# Patient Record
Sex: Male | Born: 1985 | Race: Black or African American | Hispanic: No | Marital: Single | Smoking: Never smoker
Health system: Southern US, Community
[De-identification: ages and names within clinical notes are randomized; demographics above are authoritative.]

---

## 2019-08-04 ENCOUNTER — Other Ambulatory Visit: Payer: Self-pay

## 2019-08-04 ENCOUNTER — Emergency Department
Admission: EM | Admit: 2019-08-04 | Discharge: 2019-08-04 | Disposition: A | Discharge status: Home / Self Care | Payer: Self-pay | Source: Home / Self Care | Attending: Family Medicine | Admitting: Family Medicine

## 2019-08-04 DIAGNOSIS — M7021 Olecranon bursitis, right elbow: Secondary | ICD-10-CM

## 2019-08-04 LAB — POCT CBC W AUTO DIFF (K'VILLE URGENT CARE)

## 2019-08-04 LAB — URIC ACID: Uric Acid, Serum: 6.2 mg/dL (ref 4.0–8.0)

## 2019-08-04 LAB — EXTRA LAV TOP TUBE

## 2019-08-04 MED ORDER — DOXYCYCLINE HYCLATE 100 MG PO CAPS: 100.0000 mg | ORAL_CAPSULE | Freq: Two times a day (BID) | ORAL | 0 refills | Status: DC

## 2019-08-04 MED ORDER — HYDROCODONE-ACETAMINOPHEN 5-325 MG PO TABS: ORAL_TABLET | ORAL | 0 refills | Status: DC

## 2019-08-04 MED ORDER — PREDNISONE 20 MG PO TABS: ORAL_TABLET | ORAL | 0 refills | Status: DC

## 2019-08-04 NOTE — ED Triage Notes (Signed)
Pt states that he went to bed last night and woke up this morning with right elbow pain.  Has pain around the elbow and up and down the arm in a 2" diameter.

## 2019-08-04 NOTE — ED Provider Notes (Signed)
[facility]    CSN: [id number] Arrival date & time: 08/04/19  0901      History   Chief Complaint Chief Complaint  Patient presents with  . Elbow Pain    HPI Miguel Ochoa is a 34 y.o. male.   Patient awoke this morning with pain and mild swelling over the olecranon of his right elbow.  He has pain with flexion/extension.  He denies trauma to his elbow or repetitive movements of his right arm.  The history is provided by the patient.    History reviewed. No pertinent past medical history.  There are no problems to display for this patient.   History reviewed. No pertinent surgical history.     Home Medications    Prior to Admission medications   Medication Sig Start Date End Date Taking? Authorizing Provider  doxycycline (VIBRAMYCIN) 100 MG capsule Take 1 capsule (100 mg total) by mouth 2 (two) times daily. Take with food. 08/04/19   Lattie Haw, MD  HYDROcodone-acetaminophen (NORCO/VICODIN) 5-325 MG tablet Take one by mouth at bedtime as needed for pain 08/04/19   Lattie Haw, MD  predniSONE (DELTASONE) 20 MG tablet Take one tab by mouth twice daily for 4 days, then one daily for 3 days. Take with food. 08/04/19   Lattie Haw, MD    Family History History reviewed. No pertinent family history.  Social History Social History   Tobacco Use  . Smoking status: Never Smoker  . Smokeless tobacco: Never Used  Substance Use Topics  . Alcohol use: Yes    Comment: 1-2 on weekends  . Drug use: Not Currently     Allergies   Patient has no known allergies.   Review of Systems Review of Systems  Constitutional: Negative for appetite change, chills, diaphoresis, fatigue and fever.  Musculoskeletal: Positive for joint swelling.  Skin: Negative for color change, rash and wound.  All other systems reviewed and are negative.    Physical Exam Triage Vital Signs ED Triage Vitals  Enc Vitals Group     BP 08/04/19 0920 (!) 150/95     Pulse  Rate 08/04/19 0920 80     Resp 08/04/19 0920 20     Temp 08/04/19 0920 98.5 F (36.9 C)     Temp Source 08/04/19 0920 Oral     SpO2 08/04/19 0920 96 %     Weight 08/04/19 0921 198 lb (89.8 kg)     Height 08/04/19 0921 5\' 6"  (1.676 m)     Head Circumference --      Peak Flow --      Pain Score 08/04/19 0920 5     Pain Loc --      Pain Edu? --      Excl. in GC? --    No data found.  Updated Vital Signs BP (!) 150/95 (BP Location: Left Arm)   Pulse 80   Temp 98.5 F (36.9 C) (Oral)   Resp 20   Ht 5\' 6"  (1.676 m)   Wt 89.8 kg   SpO2 96%   BMI 31.96 kg/m   Visual Acuity Right Eye Distance:   Left Eye Distance:   Bilateral Distance:    Right Eye Near:   Left Eye Near:    Bilateral Near:     Physical Exam Vitals and nursing note reviewed.  Constitutional:      General: He is not in acute distress. HENT:     Head: Normocephalic.  Eyes:  Pupils: Pupils are equal, round, and reactive to light.  Cardiovascular:     Rate and Rhythm: Normal rate.  Pulmonary:     Effort: Pulmonary effort is normal.  Musculoskeletal:     Right elbow: Swelling present. No deformity, effusion or lacerations. Decreased range of motion. Tenderness present in olecranon process.       Arms:     Comments: Right elbow has decreased flexion/extension.  He has pain in olecranon with resisted extension of his right elbow.  There is warmth and distinct tenderness to palpation over olecreanon but minimal swelling.  No erythema present.  Distal neurovascular function is intact.   Skin:    General: Skin is warm and dry.     Findings: No rash.  Neurological:     Mental Status: He is alert.      UC Treatments / Results  Labs (all labs ordered are listed, but only abnormal results are displayed) Labs Reviewed  URIC ACID  POCT CBC W AUTO DIFF ([facility]):  WBC 11.8 LY 17.4; MO 5.6 ; GR 77.0; Hgb 15.0; Platelets 296     EKG   Radiology No results found.  Procedures Procedures  (including critical care time)  Medications Ordered in UC Medications - No data to display  Initial Impression / Assessment and Plan / UC Course  I have reviewed the triage vital signs and the nursing notes.  Pertinent labs & imaging results that were available during my care of the patient were reviewed by me and considered in my medical decision making (see chart for details).    Note leukocytosis (WBC 11.8). Suspect gout.  However, because of mild leukocytosis, will cover for cellulitis with doxycycline. Check Uric acid. Begin prednisone burst/taper. Rx for Lortab for pain at night (#5, no refill) Controlled Substance Prescriptions I have consulted the [place] Controlled Substances Registry for this patient, and feel the risk/benefit ratio today is favorable for proceeding with this prescription for a controlled substance.  Followup with Dr. Aundria Mems ([facility]) if not improving about two weeks.    Final Clinical Impressions(s) / UC Diagnoses   Final diagnoses:  Olecranon bursitis of right elbow     Discharge Instructions     Increase fluid intake. Apply ice pack for 20 to 30 minutes, 3 to 4 times daily  Continue until pain and swelling decrease.  May take Tylenol daytime if needed for pain.  If symptoms become significantly worse during the night or over the weekend, proceed to the local emergency room.     ED Prescriptions    Medication Sig Dispense Auth. Provider   predniSONE (DELTASONE) 20 MG tablet Take one tab by mouth twice daily for 4 days, then one daily for 3 days. Take with food. 11 tablet Kandra Nicolas, MD   doxycycline (VIBRAMYCIN) 100 MG capsule Take 1 capsule (100 mg total) by mouth 2 (two) times daily. Take with food. 14 capsule Kandra Nicolas, MD   HYDROcodone-acetaminophen (NORCO/VICODIN) 5-325 MG tablet Take one by mouth at bedtime as needed for pain 5 tablet Kandra Nicolas, MD        Kandra Nicolas, MD 08/04/19  1039

## 2019-08-04 NOTE — Discharge Instructions (Addendum)
Increase fluid intake. Apply ice pack for 20 to 30 minutes, 3 to 4 times daily  Continue until pain and swelling decrease.  May take Tylenol daytime if needed for pain.  If symptoms become significantly worse during the night or over the weekend, proceed to the local emergency room.

## 2020-05-01 ENCOUNTER — Emergency Department (HOSPITAL_COMMUNITY): Payer: No Typology Code available for payment source

## 2020-05-01 ENCOUNTER — Emergency Department (HOSPITAL_COMMUNITY)
Admission: EM | Admit: 2020-05-01 | Discharge: 2020-05-02 | Disposition: A | Discharge status: Home / Self Care | Payer: No Typology Code available for payment source | Attending: Emergency Medicine | Admitting: Emergency Medicine

## 2020-05-01 ENCOUNTER — Encounter (HOSPITAL_COMMUNITY): Payer: Self-pay

## 2020-05-01 DIAGNOSIS — U071 COVID-19: Secondary | ICD-10-CM | POA: Diagnosis not present

## 2020-05-01 DIAGNOSIS — R0602 Shortness of breath: Secondary | ICD-10-CM

## 2020-05-01 DIAGNOSIS — R079 Chest pain, unspecified: Secondary | ICD-10-CM

## 2020-05-01 LAB — URINALYSIS, ROUTINE W REFLEX MICROSCOPIC
Bacteria, UA: NONE SEEN
Bilirubin Urine: NEGATIVE
Glucose, UA: NEGATIVE mg/dL
Ketones, ur: NEGATIVE mg/dL
Leukocytes,Ua: NEGATIVE
Nitrite: NEGATIVE
Protein, ur: NEGATIVE mg/dL
Specific Gravity, Urine: 1.034 — ABNORMAL HIGH (ref 1.005–1.030)
pH: 6 (ref 5.0–8.0)

## 2020-05-01 LAB — CBC
HCT: 44.7 % (ref 39.0–52.0)
Hemoglobin: 14.5 g/dL (ref 13.0–17.0)
MCH: 28.7 pg (ref 26.0–34.0)
MCHC: 32.4 g/dL (ref 30.0–36.0)
MCV: 88.3 fL (ref 80.0–100.0)
Platelets: 311 10*3/uL (ref 150–400)
RBC: 5.06 MIL/uL (ref 4.22–5.81)
RDW: 11.3 % — ABNORMAL LOW (ref 11.5–15.5)
WBC: 6.3 10*3/uL (ref 4.0–10.5)
nRBC: 0 % (ref 0.0–0.2)

## 2020-05-01 LAB — BASIC METABOLIC PANEL
Anion gap: 12 (ref 5–15)
BUN: 9 mg/dL (ref 6–20)
CO2: 24 mmol/L (ref 22–32)
Calcium: 9.2 mg/dL (ref 8.9–10.3)
Chloride: 100 mmol/L (ref 98–111)
Creatinine, Ser: 0.94 mg/dL (ref 0.61–1.24)
GFR, Estimated: >60 mL/min (ref >60)
Glucose, Bld: 103 mg/dL — ABNORMAL HIGH (ref 70–99)
Potassium: 3.6 mmol/L (ref 3.5–5.1)
Sodium: 136 mmol/L (ref 135–145)

## 2020-05-01 LAB — LACTIC ACID, PLASMA
Lactic Acid, Venous: 1.4 mmol/L (ref 0.5–1.9)
Lactic Acid, Venous: 0.9 mmol/L (ref 0.5–1.9)

## 2020-05-01 LAB — D-DIMER, QUANTITATIVE: D-Dimer, Quant: 1.1 ug/mL-FEU — ABNORMAL HIGH (ref 0.00–0.50)

## 2020-05-01 LAB — TROPONIN I (HIGH SENSITIVITY)
Troponin I (High Sensitivity): 16 ng/L (ref <18)
Troponin I (High Sensitivity): 13 ng/L (ref <18)

## 2020-05-01 MED ORDER — IOHEXOL 350 MG/ML SOLN
75.0000 mL | Freq: Once | INTRAVENOUS | Status: AC | PRN
  Administered 2020-05-01: 75 mL via INTRAVENOUS

## 2020-05-01 MED ORDER — SODIUM CHLORIDE 0.9 % IV BOLUS
1000.0000 mL | Freq: Once | INTRAVENOUS | Status: AC
  Administered 2020-05-01: 1000 mL via INTRAVENOUS

## 2020-05-01 NOTE — ED Triage Notes (Signed)
Pt reports feeling sob over the past couple days. Pt reports testing positive for covid on 10/9. Pt also reports feeling dehydrated, and weak.

## 2020-05-01 NOTE — ED Provider Notes (Signed)
[facility] EMERGENCY DEPARTMENT Provider Note   CSN: [id number] Arrival date & time: 05/01/20  1750     History Chief Complaint  Patient presents with  . Shortness of Breath    Miguel Ochoa is a 34 y.o. male.  The history is provided by the patient and medical records. No language interpreter was used.  Shortness of Breath Severity:  Severe Onset quality:  Gradual Timing:  Constant Chronicity:  New Context: URI (covid)   Relieved by:  Nothing Worsened by:  Deep breathing and coughing Ineffective treatments:  None tried Associated symptoms: chest pain, cough, fever, hemoptysis and sputum production   Associated symptoms: no abdominal pain, no headaches, no neck pain, no rash, no vomiting and no wheezing        History reviewed. No pertinent past medical history.  There are no problems to display for this patient.   History reviewed. No pertinent surgical history.     No family history on file.  Social History   Tobacco Use  . Smoking status: Never Smoker  . Smokeless tobacco: Never Used  Substance Use Topics  . Alcohol use: Yes    Comment: 1-2 on weekends  . Drug use: Not Currently    Home Medications Prior to Admission medications   Medication Sig Start Date End Date Taking? Authorizing Provider  doxycycline (VIBRAMYCIN) 100 MG capsule Take 1 capsule (100 mg total) by mouth 2 (two) times daily. Take with food. 08/04/19   Lattie Haw, MD  HYDROcodone-acetaminophen (NORCO/VICODIN) 5-325 MG tablet Take one by mouth at bedtime as needed for pain 08/04/19   Lattie Haw, MD  predniSONE (DELTASONE) 20 MG tablet Take one tab by mouth twice daily for 4 days, then one daily for 3 days. Take with food. 08/04/19   Lattie Haw, MD    Allergies    Patient has no known allergies.  Review of Systems   Review of Systems  Constitutional: Positive for chills, fatigue and fever.  HENT: Positive for congestion.   Respiratory: Positive  for cough, hemoptysis, sputum production, chest tightness and shortness of breath. Negative for wheezing.   Cardiovascular: Positive for chest pain. Negative for palpitations.  Gastrointestinal: Negative for abdominal pain, constipation, diarrhea, nausea and vomiting.  Genitourinary: Negative for flank pain.  Musculoskeletal: Negative for back pain, neck pain and neck stiffness.  Skin: Negative for rash and wound.  Neurological: Negative for light-headedness and headaches.  Psychiatric/Behavioral: Negative for agitation and confusion.  All other systems reviewed and are negative.   Physical Exam Updated Vital Signs BP 131/88 (BP Location: Left Arm)   Pulse (!) 101   Temp 99.3 F (37.4 C) (Oral)   Resp 17   SpO2 95%   Physical Exam Vitals and nursing note reviewed.  Constitutional:      General: He is not in acute distress.    Appearance: He is well-developed. He is not ill-appearing, toxic-appearing or diaphoretic.  HENT:     Head: Normocephalic and atraumatic.  Eyes:     Conjunctiva/sclera: Conjunctivae normal.     Pupils: Pupils are equal, round, and reactive to light.  Cardiovascular:     Rate and Rhythm: Normal rate and regular rhythm.     Heart sounds: No murmur heard.   Pulmonary:     Effort: Pulmonary effort is normal. Tachypnea present. No respiratory distress.     Breath sounds: Rhonchi present. No decreased breath sounds, wheezing or rales.  Chest:     Chest wall: No  tenderness.  Abdominal:     Palpations: Abdomen is soft.     Tenderness: There is no abdominal tenderness.  Musculoskeletal:     Cervical back: Neck supple.     Right lower leg: No tenderness. No edema.     Left lower leg: No tenderness. No edema.  Skin:    General: Skin is warm and dry.     Capillary Refill: Capillary refill takes less than 2 seconds.     Findings: No erythema.  Neurological:     General: No focal deficit present.     Mental Status: He is alert.  Psychiatric:        Mood  and Affect: Mood normal.     ED Results / Procedures / Treatments   Labs (all labs ordered are listed, but only abnormal results are displayed) Labs Reviewed  BASIC METABOLIC PANEL - Abnormal; Notable for the following components:      Result Value   Glucose, Bld 103 (*)    All other components within normal limits  CBC - Abnormal; Notable for the following components:   RDW 11.3 (*)    All other components within normal limits  D-DIMER, QUANTITATIVE (NOT AT [facility]) - Abnormal; Notable for the following components:   D-Dimer, Quant 1.10 (*)    All other components within normal limits  URINALYSIS, ROUTINE W REFLEX MICROSCOPIC - Abnormal; Notable for the following components:   Specific Gravity, Urine 1.034 (*)    Hgb urine dipstick SMALL (*)    All other components within normal limits  URINE CULTURE  LACTIC ACID, PLASMA  LACTIC ACID, PLASMA  TROPONIN I (HIGH SENSITIVITY)  TROPONIN I (HIGH SENSITIVITY)    EKG EKG Interpretation  Date/Time:  Saturday May 01 2020 17:56:34 EDT Ventricular Rate:  102 PR Interval:  128 QRS Duration: 70 QT Interval:  316 QTC Calculation: 411 R Axis:   26 Text Interpretation: Sinus tachycardia Cannot rule out Anterior infarct , age undetermined Abnormal ECG No prior ECG for comparison. No STEMI Confirmed by Theda Belfast ([id number]) on 05/01/2020 6:27:24 PM   Radiology CT Angio Chest PE W and/or Wo Contrast  Result Date: 05/01/2020 CLINICAL DATA:  Positive COVID increased D-dimer EXAM: CT ANGIOGRAPHY CHEST WITH CONTRAST TECHNIQUE: Multidetector CT imaging of the chest was performed using the standard protocol during bolus administration of intravenous contrast. Multiplanar CT image reconstructions and MIPs were obtained to evaluate the vascular anatomy. CONTRAST:  60mL OMNIPAQUE IOHEXOL 350 MG/ML SOLN COMPARISON:  None. FINDINGS: Cardiovascular: There is a optimal opacification of the pulmonary arteries. There is no central,segmental, or  subsegmental filling defects within the pulmonary arteries. The heart is normal in size. No pericardial effusion or thickening. No evidence right heart strain. There is normal three-vessel brachiocephalic anatomy without proximal stenosis. The thoracic aorta is normal in appearance. Mediastinum/Nodes: No hilar, mediastinal, or axillary adenopathy. Thyroid gland, trachea, and esophagus demonstrate no significant findings. Lungs/Pleura: Multifocal ground-glass patchy airspace opacities are seen throughout both lungs, predominantly within the periphery. No pneumothorax or pleural effusion is seen. Upper Abdomen: No acute abnormalities present in the visualized portions of the upper abdomen. Musculoskeletal: No chest wall abnormality. No acute or significant osseous findings. Review of the MIP images confirms the above findings. IMPRESSION: No central, segmental, or subsegmental pulmonary embolism. Multifocal airspace opacities throughout both lungs, consistent with atypical viral pneumonia. Electronically Signed   By: Jonna Clark M.D.   On: 05/01/2020 21:54   DG Chest Portable 1 View  Result Date: 05/01/2020 CLINICAL DATA:  COVID positive last week. Worsening shortness of breath, fatigue, and chest tightness EXAM: PORTABLE CHEST 1 VIEW COMPARISON:  None. FINDINGS: Shallow inspiration. Patchy infiltrates in the lung bases, likely multifocal pneumonia. No pleural effusions. No pneumothorax. Mediastinal contours appear intact. IMPRESSION: Patchy infiltrates in the lung bases, likely multifocal pneumonia. Electronically Signed   By: Burman NievesWilliam  Stevens M.D.   On: 05/01/2020 19:32    Procedures Procedures (including critical care time)  Medications Ordered in ED Medications  sodium chloride 0.9 % bolus 1,000 mL (0 mLs Intravenous Stopped 05/01/20 2231)  iohexol (OMNIPAQUE) 350 MG/ML injection 75 mL (75 mLs Intravenous Contrast Given 05/01/20 2116)    ED Course  I have reviewed the triage vital signs and the  nursing notes.  Pertinent labs & imaging results that were available during my care of the patient were reviewed by me and considered in my medical decision making (see chart for details).    MDM Rules/Calculators/A&P                          Patrica DuelJeremy Simmer is a 34 y.o. male with no significant past medical history aside from recent diagnosis of COVID-19 2 weeks ago who presents with worsening chest tightness, shortness of breath, fatigue, urinary frequency, and malaise.  Patient reports that he was diagnosed with Covid 2 weeks ago and has had worsened short of breath and chest tightness over the last few days.  He reports the cough is somewhat improved but he still has some occasional hemoptysis and productive cough.  He reports no significant nausea, vomiting, constipation or diarrhea he does report urinary frequency.  No dysuria.  No recent leg pain or leg swelling.  He feels very fatigued and tired.  He feels he is very dehydrated despite trying to increase his fluids.  He is concerned about his breathing.  On exam, lungs have some rhonchi bilaterally with no significant wheezing.  No rales.  Chest is nontender, abdomen is nontender.  Good pulses in extremities.  No leg tenderness or leg swelling appreciated.  Patient was tachycardic on arrival and had temperature of 99.3 orally.  We will ambulate patient to make sure he is not hypoxic with ambulation.  He reports his symptoms were primarily pleuritic and exertional.  We will get a work-up to look for UTI given the increased urinary frequency.  We will get x-ray and labs including a D-dimer to rule out pulmonary embolism.  EKG did not show STEMI.  If patient is not hypoxic, do not find concerning abnormalities, anticipate he will be stable for discharge home with PCP follow-up.  Anticipate reassessment after work-up.   Final Clinical Impression(s) / ED Diagnoses Final diagnoses:  SOB (shortness of breath)  COVID-19  Chest pain, unspecified  type    Rx / DC Orders ED Discharge Orders    None      Clinical Impression: 1. SOB (shortness of breath)   2. COVID-19   3. Chest pain, unspecified type     Disposition: Discharge  Condition: Good  I have discussed the results, Dx and Tx plan with the pt(& family if present). He/she/they expressed understanding and agree(s) with the plan. Discharge instructions discussed at great length. Strict return precautions discussed and pt &/or family have verbalized understanding of the instructions. No further questions at time of discharge.    New Prescriptions   No medications on file    [facility] Up[facility] [address] [place] [address] [phone]    [facility] EMERGENCY DEPARTMENT [address] [id number] mc [place] [address] [phone]       Chandani Rogowski, Canary Brim, MD 05/01/20 [phone]

## 2020-05-01 NOTE — Discharge Instructions (Signed)
Your work-up today did not show evidence of collapsed lung, bacterial pneumonia, or blood clot.  Your heart enzymes were reassuring and your vital signs were also reassuring.  Please follow-up with the post Covid clinic and rest and stay hydrated.  If any symptoms change or worsen, please return to the nearest emergency department.

## 2021-11-13 ENCOUNTER — Other Ambulatory Visit: Payer: Self-pay

## 2021-11-13 ENCOUNTER — Encounter (HOSPITAL_COMMUNITY): Payer: Self-pay

## 2021-11-13 ENCOUNTER — Emergency Department (HOSPITAL_COMMUNITY): Payer: BC Managed Care – PPO

## 2021-11-13 ENCOUNTER — Emergency Department (HOSPITAL_COMMUNITY)
Admission: EM | Admit: 2021-11-13 | Discharge: 2021-11-14 | Disposition: A | Discharge status: Home / Self Care | Payer: BC Managed Care – PPO | Attending: Emergency Medicine | Admitting: Emergency Medicine

## 2021-11-13 DIAGNOSIS — S0231XA Fracture of orbital floor, right side, initial encounter for closed fracture: Secondary | ICD-10-CM | POA: Insufficient documentation

## 2021-11-13 DIAGNOSIS — S5011XA Contusion of right forearm, initial encounter: Secondary | ICD-10-CM | POA: Diagnosis not present

## 2021-11-13 DIAGNOSIS — S0285XA Fracture of orbit, unspecified, initial encounter for closed fracture: Secondary | ICD-10-CM

## 2021-11-13 DIAGNOSIS — S0993XA Unspecified injury of face, initial encounter: Secondary | ICD-10-CM | POA: Diagnosis present

## 2021-11-13 DIAGNOSIS — S60221A Contusion of right hand, initial encounter: Secondary | ICD-10-CM | POA: Diagnosis not present

## 2021-11-13 DIAGNOSIS — S01111A Laceration without foreign body of right eyelid and periocular area, initial encounter: Secondary | ICD-10-CM | POA: Diagnosis not present

## 2021-11-13 DIAGNOSIS — Z23 Encounter for immunization: Secondary | ICD-10-CM | POA: Insufficient documentation

## 2021-11-13 DIAGNOSIS — S0240EA Zygomatic fracture, right side, initial encounter for closed fracture: Secondary | ICD-10-CM | POA: Diagnosis not present

## 2021-11-13 DIAGNOSIS — H1131 Conjunctival hemorrhage, right eye: Secondary | ICD-10-CM

## 2021-11-13 DIAGNOSIS — S01119A Laceration without foreign body of unspecified eyelid and periocular area, initial encounter: Secondary | ICD-10-CM
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
## 2021-11-14 MED ORDER — TETANUS-DIPHTH-ACELL PERTUSSIS 5-2.5-18.5 LF-MCG/0.5 IM SUSY
0.5000 mL | PREFILLED_SYRINGE | Freq: Once | INTRAMUSCULAR | Status: AC
  Administered 2021-11-14: 0.5 mL via INTRAMUSCULAR
  Filled 2021-11-14: qty 0.5

## 2021-11-14 MED ORDER — OXYCODONE HCL 5 MG PO TABS: 5.0000 mg | ORAL_TABLET | ORAL | 0 refills | Status: AC | PRN

## 2021-11-14 MED ORDER — AMOXICILLIN-POT CLAVULANATE 875-125 MG PO TABS: 1.0000 | ORAL_TABLET | Freq: Two times a day (BID) | ORAL | 0 refills | Status: AC

## 2021-11-14 NOTE — Discharge Instructions (Addendum)
Call the eye doctor tomorrow morning for a follow up appointment. ?Follow up with orthopedics if your hand/arm pain persists. Apply ice for 20 minutes at a time and elevate. ?Antibiotics as prescribed for open wound over fracture, take as prescribed. ?

## 2023-10-09 IMAGING — CT CT MAXILLOFACIAL W/O CM
3 of 6 series · 15 of 47 positions shown, 18 images · non-contrast
Comparison: None Available.

CLINICAL DATA: Facial trauma.



[Series 3: maxilllofacial 2.0 hr40 3 · axial · 0.39mm/px · z∈[+1344,+1492]mm · 10 of 88 slices shown, 13 images]
[im 7/88  brain]
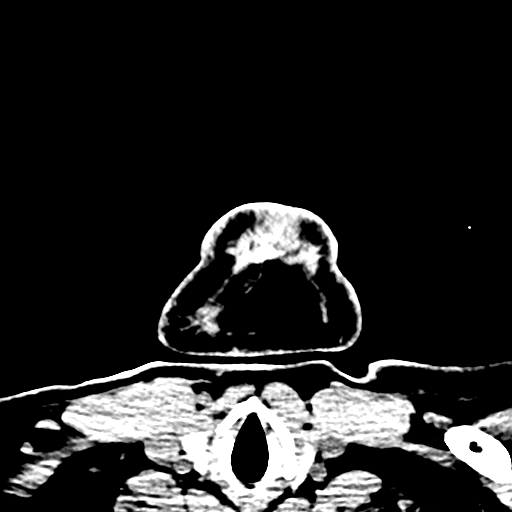
[im 7/88  bone]
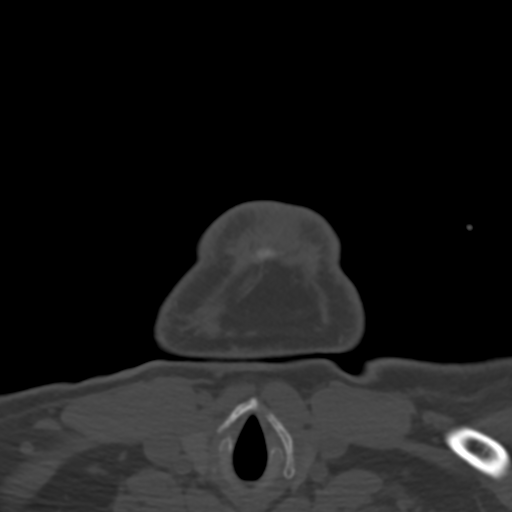
[im 13/88  bone]
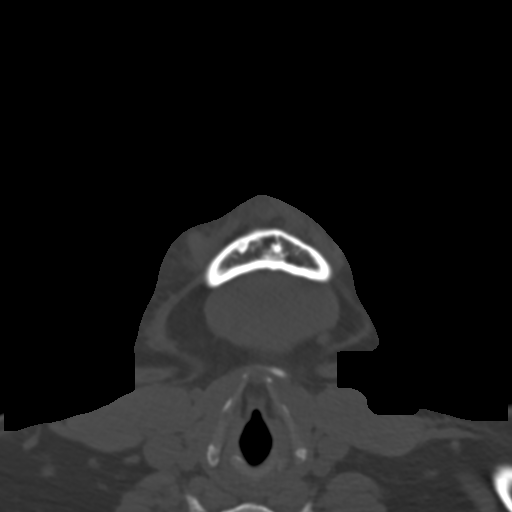
[im 25/88  bone]
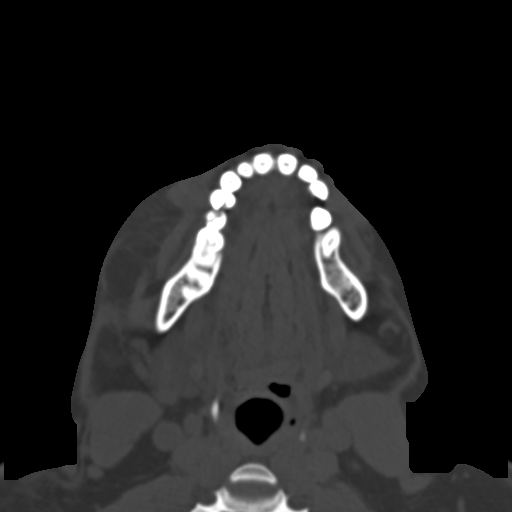
[im 32/88  bone]
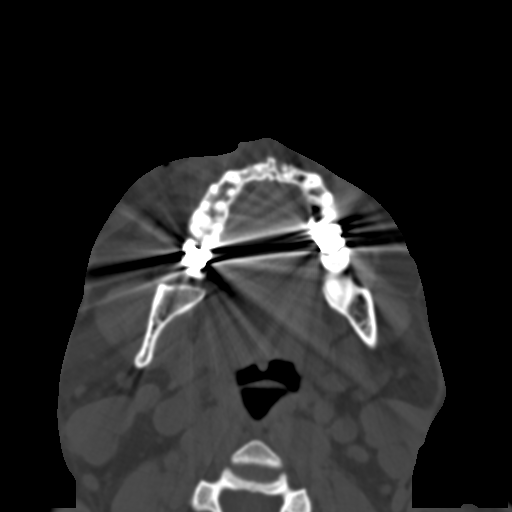
[im 38/88  brain]
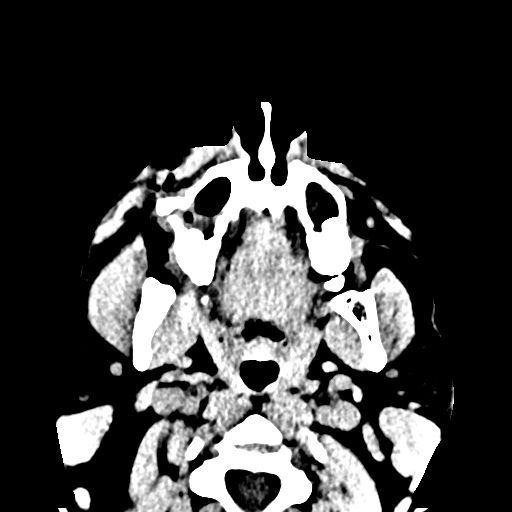
[im 38/88  bone]
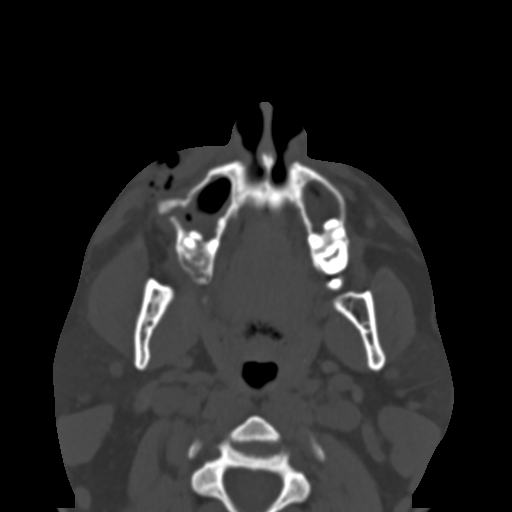
[im 50/88  bone]
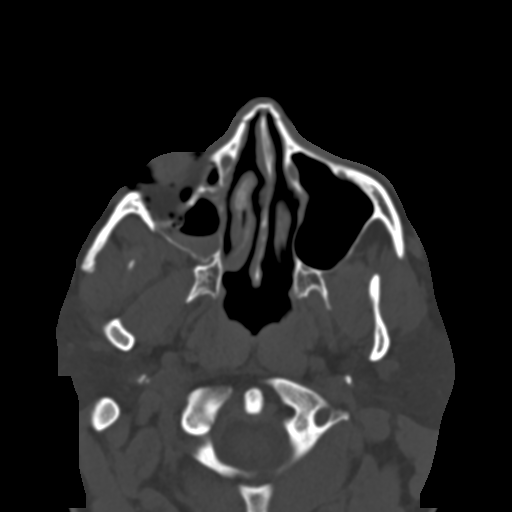
[im 56/88  bone]
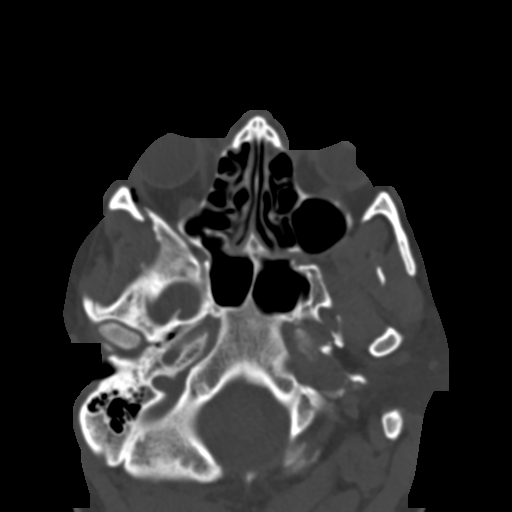
[im 63/88  bone]
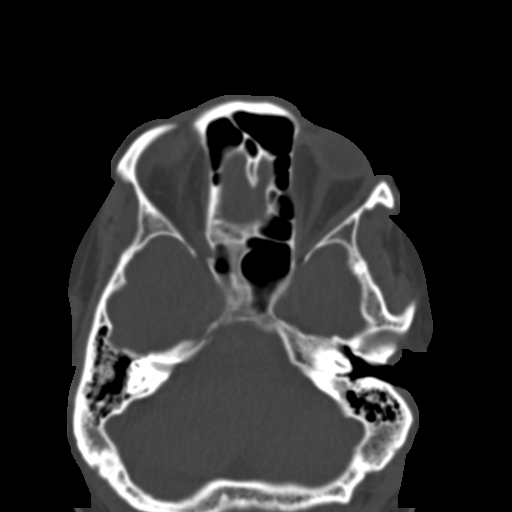
[im 75/88  brain]
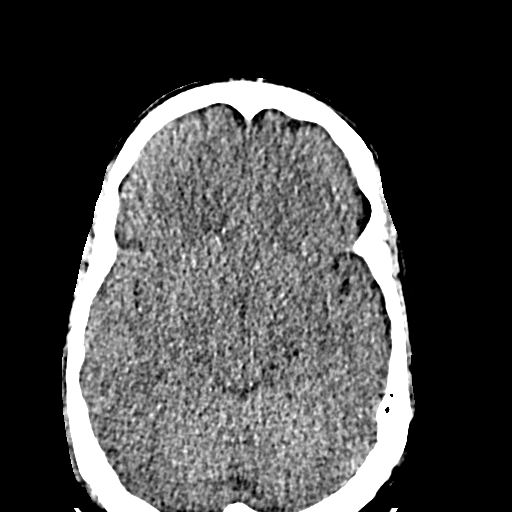
[im 75/88  bone]
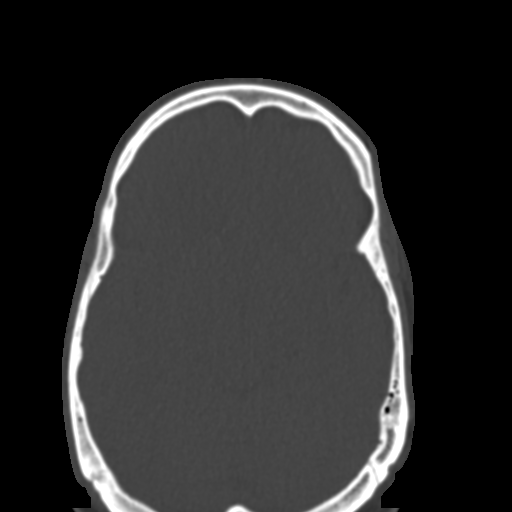
[im 81/88  bone]
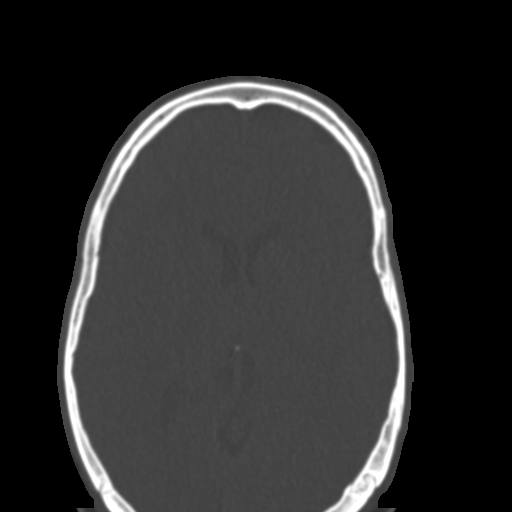

[Series 7: st cor · coronal · 0.37mm/px · 3 of 86 slices shown]
[im 22/86  bone]
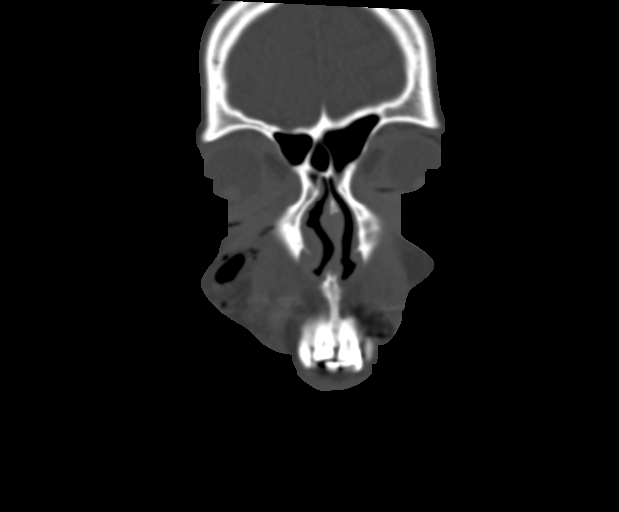
[im 43/86  bone]
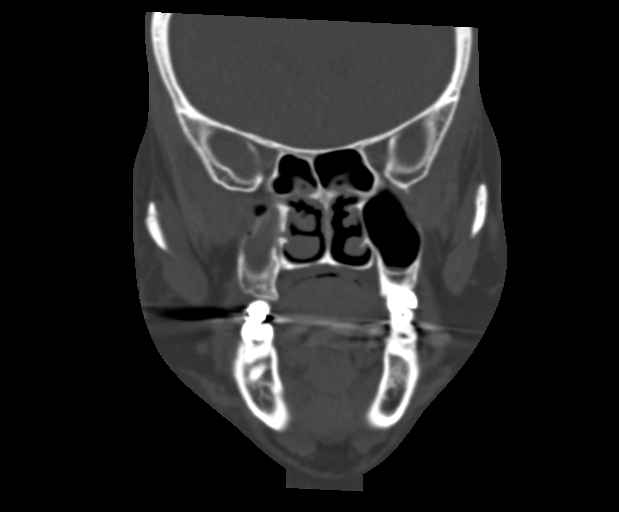
[im 64/86  bone]
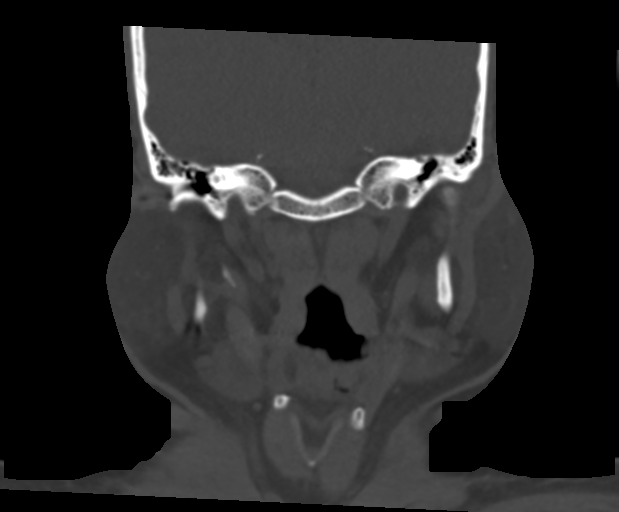

[Series 8: st sag · sagittal · 0.36mm/px · 2 of 96 slices shown]
[im 32/96  bone]
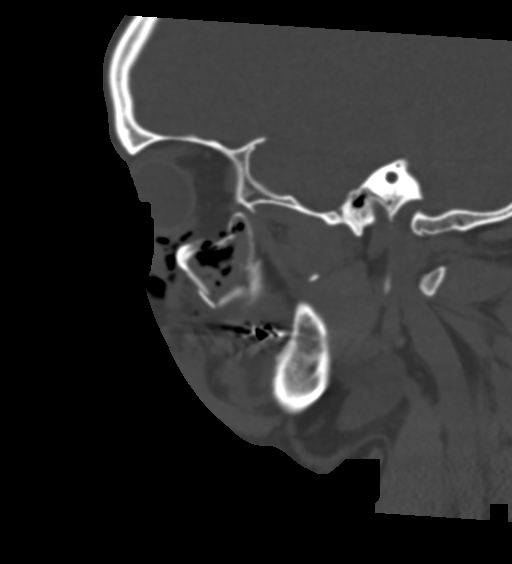
[im 64/96  bone]
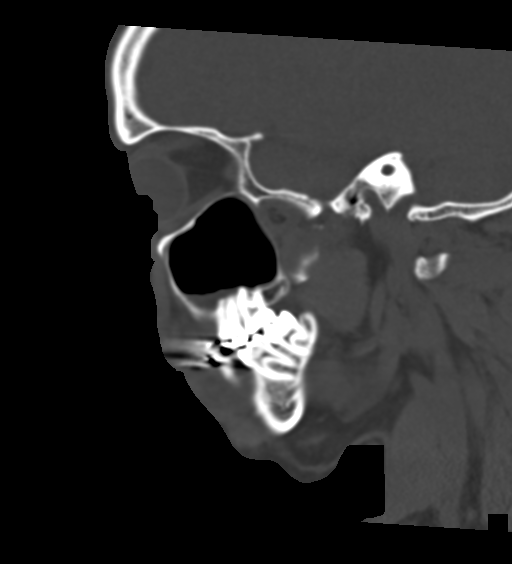

[15 of 47 positions shown; findings below may reference images not displayed]

FINDINGS: Osseous: There is a comminuted and depressed fracture of the right
orbital floor with slight herniation of the orbital fat. There are
displaced fractures of the anterior and posterolateral walls of the
right maxillary sinus. There is irregularity of the medial wall of
the right maxillary sinus suspicious for fracture. There is a
nondisplaced fracture of the posterior right zygomatic arch.
Probable slight sutural diastasis of the midportion of the right
zygomatic arch. Mildly displaced fracture lateral wall of the right
orbit. There is no mandibular dislocation.

Orbits: The globes and retrobulbar fat are preserved. There is mild
right orbital emphysema.

Sinuses: Partial opacification of the right maxillary sinus most
consistent with hemosinus. There is mild diffuse mucoperiosteal
thickening of the main der of the paranasal sinuses.

Soft tissues: Right periorbital soft tissue swelling and air.

Limited intracranial: No significant or unexpected finding.
IMPRESSION: Right facial bone fractures as above. Clinical correlation is
recommended to exclude ocular muscle entrapment.

## 2023-10-09 IMAGING — DX DG HAND COMPLETE 3+V*R*
3 series · 3 of 3 positions shown · non-contrast
Comparison: None Available.

CLINICAL DATA: Injury. Pain in the mid and distal ulna. Pain in the
fifth metacarpal

EXAM:
RIGHT HAND - COMPLETE 3+ VIEW

[hand ap]
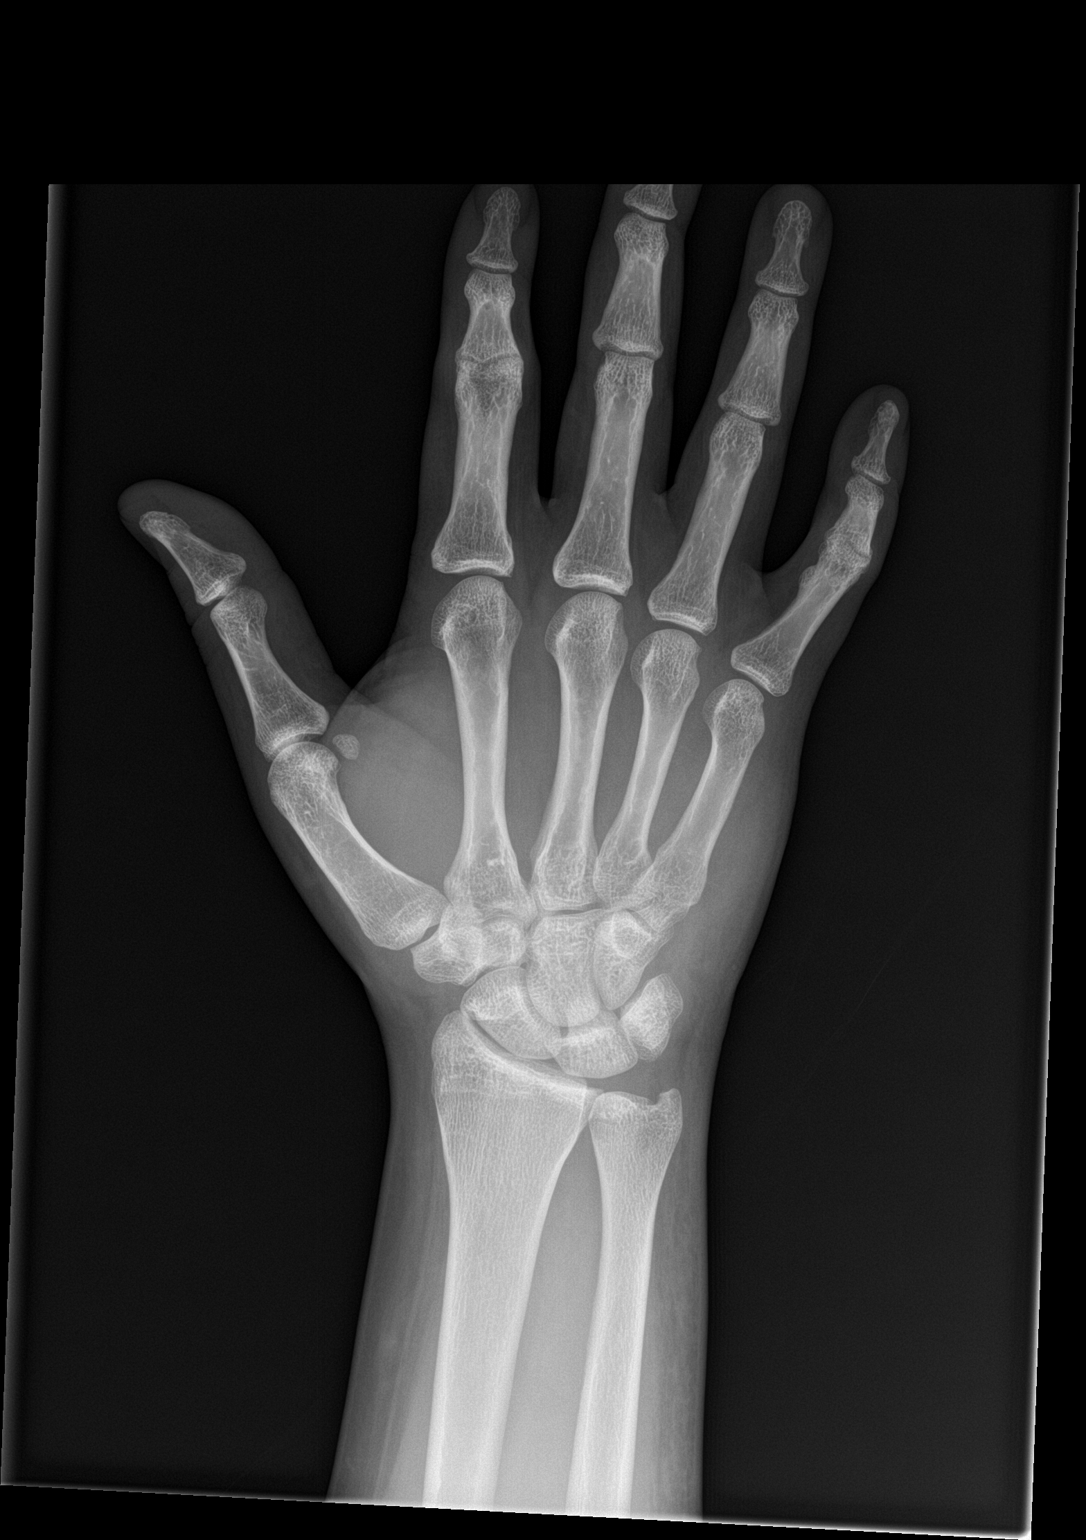

[hand obl]
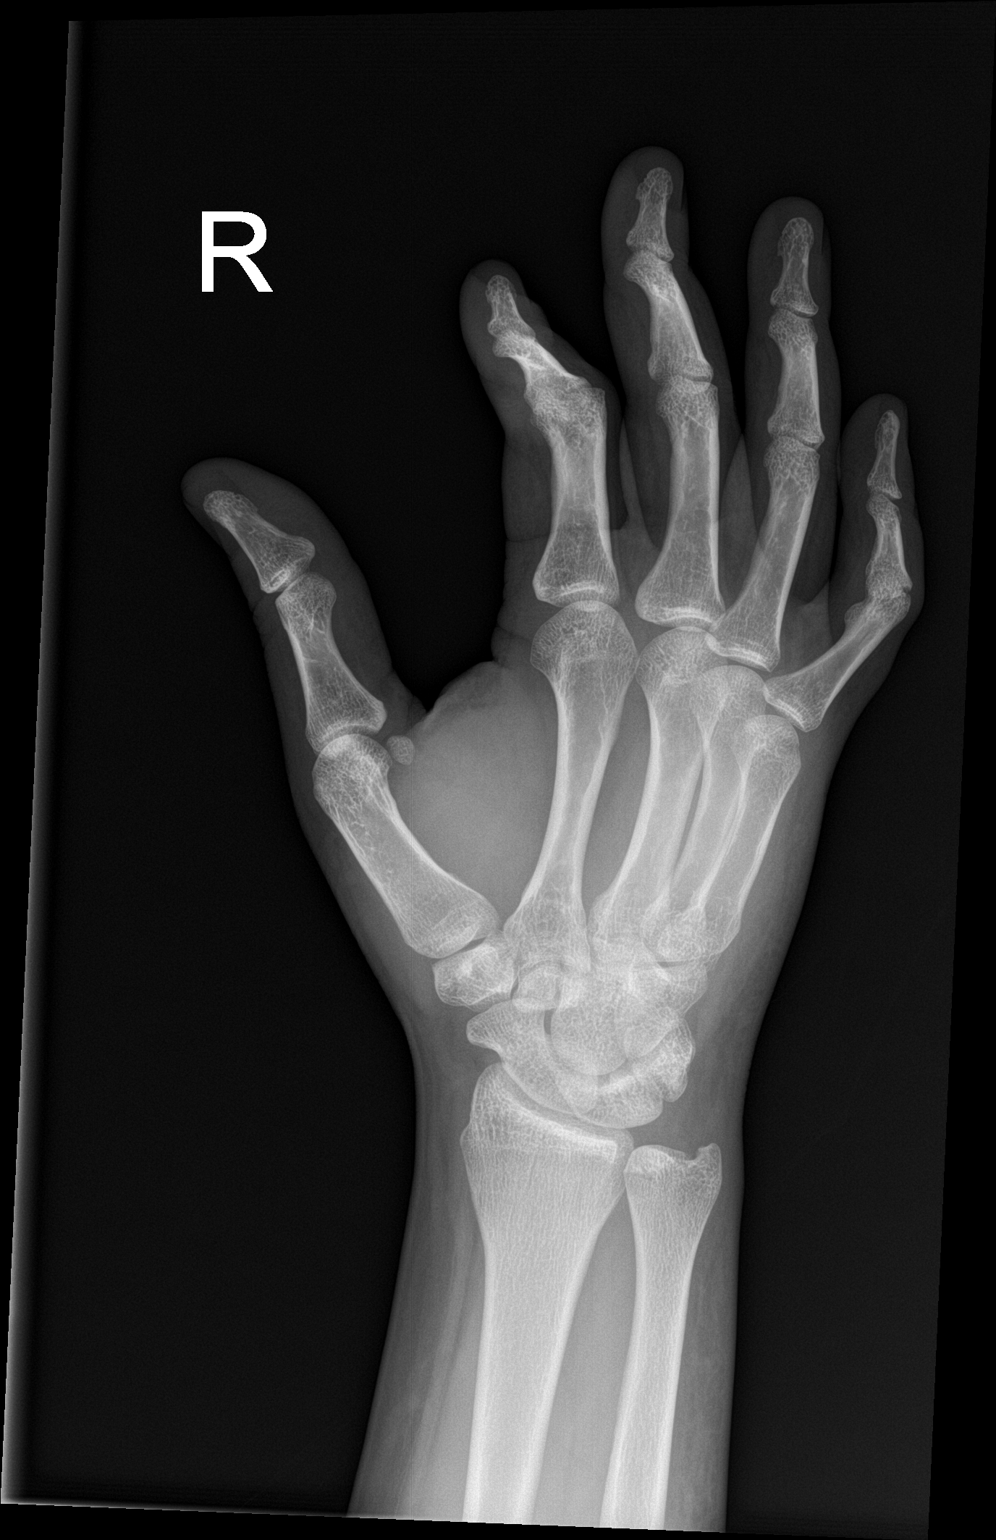

[hand lat]
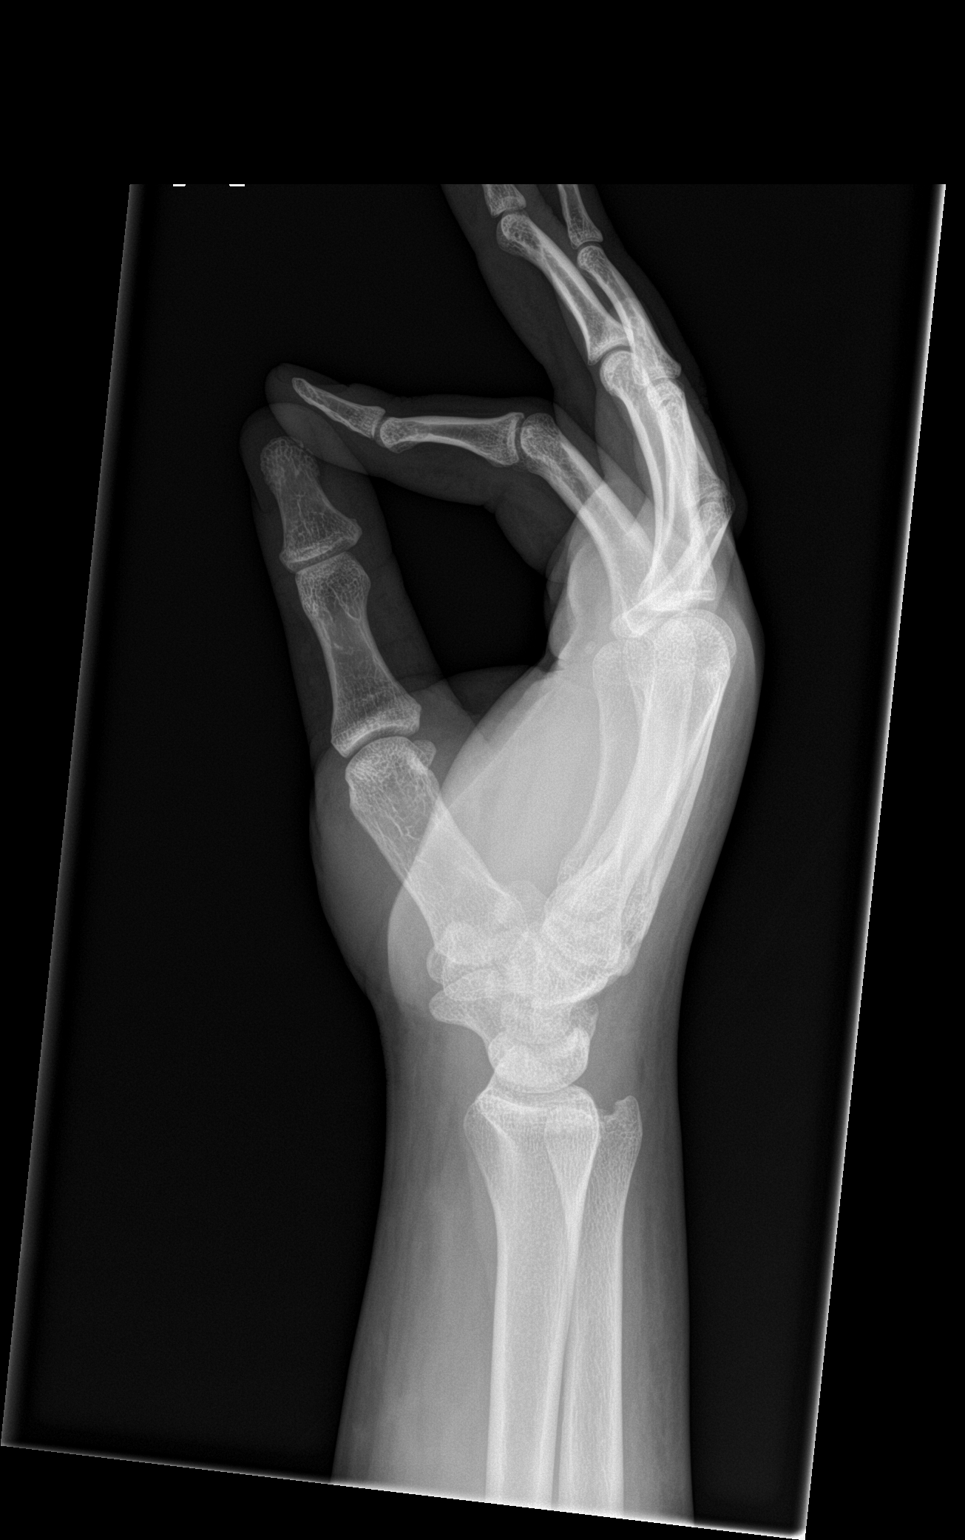

[3 of 3 positions shown; findings below may reference images not displayed]

FINDINGS: There is no evidence of fracture or dislocation. Particularly, the
fifth metacarpal is intact. The fifth digit is held in flexion at
the proximal interphalangeal joint on all views. There is a small
osseous bump about the fifth digit proximal phalanx radial aspect
that is contiguous with the cortex, and appears chronic. Tissue
edema over the dorsum of the hand.
IMPRESSION: 1. No fracture or dislocation of the right hand.
2. Fifth digit is held in flexion on all views, of unknown acuity.
3. Small osseous bump about the fifth digit proximal phalanx which
appears chronic, may represent sequela of remote injury.
4. Soft tissue edema.
